# Patient Record
Sex: Female | Born: 1980 | Hispanic: Yes | Smoking: Never smoker
Health system: Southern US, Community
[De-identification: ages and names within clinical notes are randomized; demographics above are authoritative.]

## PROBLEM LIST (undated history)

## (undated) DIAGNOSIS — E079 Disorder of thyroid, unspecified: Secondary | ICD-10-CM

## (undated) HISTORY — PX: OTHER SURGICAL HISTORY: SHX169

## (undated) HISTORY — DX: Disorder of thyroid, unspecified: E07.9

---

## 2005-08-02 DIAGNOSIS — Z227 Latent tuberculosis: Secondary | ICD-10-CM

## 2005-08-02 HISTORY — DX: Latent tuberculosis: Z22.7

## 2015-08-03 DIAGNOSIS — A6000 Herpesviral infection of urogenital system, unspecified: Secondary | ICD-10-CM

## 2015-08-03 HISTORY — DX: Herpesviral infection of urogenital system, unspecified: A60.00

## 2022-01-21 ENCOUNTER — Emergency Department
Admission: EM | Admit: 2022-01-21 | Discharge: 2022-01-22 | Disposition: A | Discharge status: Home / Self Care | Payer: Self-pay | Attending: Emergency Medicine | Admitting: Emergency Medicine

## 2022-01-21 ENCOUNTER — Encounter: Payer: Self-pay | Admitting: Emergency Medicine

## 2022-01-21 ENCOUNTER — Emergency Department: Payer: Self-pay

## 2022-01-21 DIAGNOSIS — R079 Chest pain, unspecified: Secondary | ICD-10-CM | POA: Insufficient documentation

## 2022-01-21 LAB — URINALYSIS, ROUTINE W REFLEX MICROSCOPIC
Bilirubin Urine: NEGATIVE
Glucose, UA: NEGATIVE mg/dL
Ketones, ur: NEGATIVE mg/dL
Leukocytes,Ua: NEGATIVE
Nitrite: NEGATIVE
Protein, ur: NEGATIVE mg/dL
Specific Gravity, Urine: 1.017 (ref 1.005–1.030)
pH: 8 (ref 5.0–8.0)

## 2022-01-21 LAB — BASIC METABOLIC PANEL
Anion gap: 4 — ABNORMAL LOW (ref 5–15)
BUN: 16 mg/dL (ref 6–20)
CO2: 27 mmol/L (ref 22–32)
Calcium: 9.6 mg/dL (ref 8.9–10.3)
Chloride: 107 mmol/L (ref 98–111)
Creatinine, Ser: 0.66 mg/dL (ref 0.44–1.00)
GFR, Estimated: >60 mL/min (ref >60)
Glucose, Bld: 116 mg/dL — ABNORMAL HIGH (ref 70–99)
Potassium: 3.8 mmol/L (ref 3.5–5.1)
Sodium: 138 mmol/L (ref 135–145)

## 2022-01-21 LAB — CBC
HCT: 36.3 % (ref 36.0–46.0)
Hemoglobin: 12.1 g/dL (ref 12.0–15.0)
MCH: 30 pg (ref 26.0–34.0)
MCHC: 33.3 g/dL (ref 30.0–36.0)
MCV: 90.1 fL (ref 80.0–100.0)
Platelets: 396 10*3/uL (ref 150–400)
RBC: 4.03 MIL/uL (ref 3.87–5.11)
RDW: 13.3 % (ref 11.5–15.5)
WBC: 8.1 10*3/uL (ref 4.0–10.5)
nRBC: 0 % (ref 0.0–0.2)

## 2022-01-21 LAB — TROPONIN I (HIGH SENSITIVITY): Troponin I (High Sensitivity): <2 ng/L (ref <18)

## 2022-01-21 LAB — POC URINE PREG, ED: Preg Test, Ur: NEGATIVE

## 2022-01-21 NOTE — ED Triage Notes (Signed)
Pt presents via POV with complaints of left sided CP that has been going on for for the last 4 months but for the last week its has occurred 2-3 times a day. She describes pain as heaviness. Denies fevers, chills, N/V.

## 2022-03-16 ENCOUNTER — Ambulatory Visit: Payer: Self-pay | Admitting: Cardiology

## 2022-03-16 ENCOUNTER — Encounter: Payer: Self-pay | Admitting: Cardiology

## 2022-08-27 ENCOUNTER — Other Ambulatory Visit: Payer: Self-pay

## 2022-08-27 ENCOUNTER — Emergency Department: Payer: Self-pay

## 2022-08-27 ENCOUNTER — Emergency Department
Admission: EM | Admit: 2022-08-27 | Discharge: 2022-08-27 | Disposition: A | Discharge status: Home / Self Care | Payer: Self-pay | Attending: Emergency Medicine | Admitting: Emergency Medicine

## 2022-08-27 DIAGNOSIS — R102 Pelvic and perineal pain unspecified side: Secondary | ICD-10-CM

## 2022-08-27 DIAGNOSIS — N898 Other specified noninflammatory disorders of vagina: Secondary | ICD-10-CM | POA: Insufficient documentation

## 2022-08-27 LAB — COMPREHENSIVE METABOLIC PANEL
ALT: 19 U/L (ref 0–44)
AST: 26 U/L (ref 15–41)
Albumin: 4.3 g/dL (ref 3.5–5.0)
Alkaline Phosphatase: 66 U/L (ref 38–126)
Anion gap: 8 (ref 5–15)
BUN: 14 mg/dL (ref 6–20)
CO2: 23 mmol/L (ref 22–32)
Calcium: 9.3 mg/dL (ref 8.9–10.3)
Chloride: 105 mmol/L (ref 98–111)
Creatinine, Ser: 0.71 mg/dL (ref 0.44–1.00)
GFR, Estimated: >60 mL/min (ref >60)
Glucose, Bld: 110 mg/dL — ABNORMAL HIGH (ref 70–99)
Potassium: 3.6 mmol/L (ref 3.5–5.1)
Sodium: 136 mmol/L (ref 135–145)
Total Bilirubin: 0.6 mg/dL (ref 0.3–1.2)
Total Protein: 8.2 g/dL — ABNORMAL HIGH (ref 6.5–8.1)

## 2022-08-27 LAB — URINALYSIS, ROUTINE W REFLEX MICROSCOPIC
Bacteria, UA: NONE SEEN
Bilirubin Urine: NEGATIVE
Glucose, UA: NEGATIVE mg/dL
Ketones, ur: NEGATIVE mg/dL
Nitrite: NEGATIVE
Protein, ur: NEGATIVE mg/dL
Specific Gravity, Urine: 1.017 (ref 1.005–1.030)
pH: 5 (ref 5.0–8.0)

## 2022-08-27 LAB — CBC
HCT: 36.1 % (ref 36.0–46.0)
Hemoglobin: 11.7 g/dL — ABNORMAL LOW (ref 12.0–15.0)
MCH: 29.9 pg (ref 26.0–34.0)
MCHC: 32.4 g/dL (ref 30.0–36.0)
MCV: 92.3 fL (ref 80.0–100.0)
Platelets: 320 10*3/uL (ref 150–400)
RBC: 3.91 MIL/uL (ref 3.87–5.11)
RDW: 13.5 % (ref 11.5–15.5)
WBC: 7.7 10*3/uL (ref 4.0–10.5)
nRBC: 0 % (ref 0.0–0.2)

## 2022-08-27 LAB — WET PREP, GENITAL
Clue Cells Wet Prep HPF POC: NONE SEEN
Sperm: NONE SEEN
Trich, Wet Prep: NONE SEEN
WBC, Wet Prep HPF POC: <10 — AB (ref <10)
Yeast Wet Prep HPF POC: NONE SEEN

## 2022-08-27 LAB — CHLAMYDIA/NGC RT PCR (ARMC ONLY)
Chlamydia Tr: NOT DETECTED
N gonorrhoeae: NOT DETECTED

## 2022-08-27 LAB — POC URINE PREG, ED: Preg Test, Ur: NEGATIVE

## 2022-08-27 LAB — LIPASE, BLOOD: Lipase: 65 U/L — ABNORMAL HIGH (ref 11–51)

## 2022-08-27 MED ORDER — KETOROLAC TROMETHAMINE 30 MG/ML IJ SOLN
30.0000 mg | Freq: Once | INTRAMUSCULAR | Status: AC
  Administered 2022-08-27: 30 mg via INTRAMUSCULAR
  Filled 2022-08-27: qty 1

## 2022-08-27 NOTE — ED Provider Notes (Signed)
[facility] Provider Note  Patient Contact: 10:14 PM (approximate)   History   Vaginal Pain   HPI  Patty Summers is a 42 y.o. female with a largely unremarkable past medical history, presents to the emergency department with lower pelvic pain, patient localizes her pain to suprapubic region of her abdomen.  She is also had some increased vaginal discharge.  Denies unprotected sex.  No vaginal itching or deep dyspareunia.  No fever or chills.  No similar symptoms in the past.      Physical Exam   Triage Vital Signs: ED Triage Vitals [08/27/22 1938]  Enc Vitals Group     BP (!) 127/91     Pulse Rate 68     Resp 16     Temp 97.9 F (36.6 C)     Temp Source Oral     SpO2 99 %     Weight 170 lb (77.1 kg)     Height 5\' 3"  (1.6 m)     Head Circumference      Peak Flow      Pain Score 8     Pain Loc      Pain Edu?      Excl. in GC?     Most recent vital signs: Vitals:   08/27/22 1938  BP: (!) 127/91  Pulse: 68  Resp: 16  Temp: 97.9 F (36.6 C)  SpO2: 99%     General: Alert and in no acute distress. Eyes:  PERRL. EOMI. Head: No acute traumatic findings ENT:      Nose: No congestion/rhinnorhea.      Mouth/Throat: Mucous membranes are moist. Neck: No stridor. No cervical spine tenderness to palpation. Cardiovascular:  Good peripheral perfusion Respiratory: Normal respiratory effort without tachypnea or retractions. Lungs CTAB. Good air entry to the bases with no decreased or absent breath sounds. Gastrointestinal: Bowel sounds 4 quadrants. Soft and nontender to palpation. No guarding or rigidity. No palpable masses. No distention. No CVA tenderness. Musculoskeletal: Full range of motion to all extremities.  Neurologic:  No gross focal neurologic deficits are appreciated.  Skin:   No rash noted Other:   ED Results / Procedures / Treatments   Labs (all labs ordered are listed, but only abnormal results are displayed) Labs  Reviewed  WET PREP, GENITAL - Abnormal; Notable for the following components:      Result Value   WBC, Wet Prep HPF POC <10 (*)    All other components within normal limits  LIPASE, BLOOD - Abnormal; Notable for the following components:   Lipase 65 (*)    All other components within normal limits  COMPREHENSIVE METABOLIC PANEL - Abnormal; Notable for the following components:   Glucose, Bld 110 (*)    Total Protein 8.2 (*)    All other components within normal limits  CBC - Abnormal; Notable for the following components:   Hemoglobin 11.7 (*)    All other components within normal limits  URINALYSIS, ROUTINE W REFLEX MICROSCOPIC - Abnormal; Notable for the following components:   Color, Urine YELLOW (*)    APPearance CLEAR (*)    Hgb urine dipstick SMALL (*)    Leukocytes,Ua TRACE (*)    All other components within normal limits  CHLAMYDIA/NGC RT PCR ([facility] ONLY)            POC URINE PREG, ED        RADIOLOGY  I personally viewed and evaluated these images as part of my medical  decision making, as well as reviewing the written report by the radiologist.  ED Provider Interpretation: Mixed echogenicity cystic-appearing lesion of the left ovary, 1.2 cm   PROCEDURES:  Critical Care performed: No  Procedures   MEDICATIONS ORDERED IN ED: Medications  ketorolac (TORADOL) 30 MG/ML injection 30 mg (has no administration in time range)     IMPRESSION / MDM / ASSESSMENT AND PLAN / ED COURSE  I reviewed the triage vital signs and the nursing notes.                              Assessment and plan Pelvic pain 42 year old female presents to the emergency department with lower pelvic pain for the past 2 to 3 days.  Vital signs were reassuring at triage.  On exam, patient alert, active and nontoxic-appearing.  CBC and CMP reassuring.  Urine pregnancy test was negative.  Urinalysis shows no signs of UTI.  Pelvic ultrasound shows a 1.2 cm mixed echogenicity cystic-appearing  lesion of the left ovary.  Suspect ovarian cyst.  I recommended repeat ultrasound in the next 2 to 3 months to assess for resolution.  Recommend following up with gynecology.  Patient was given injection of Toradol for pain and Tylenol and ibuprofen alternating were recommended at home.  Return precautions were given to return with new or worsening symptoms.     FINAL CLINICAL IMPRESSION(S) / ED DIAGNOSES   Final diagnoses:  Pelvic pain     Rx / DC Orders   ED Discharge Orders     None        Note:  This document was prepared using Dragon voice recognition software and may include unintentional dictation errors.   Pia Mau [place], Cordelia Poche 08/27/22 2236    Willy Eddy, MD 08/28/22 [phone]

## 2022-08-27 NOTE — Discharge Instructions (Signed)
Repeat pelvic ultrasound in 3 months. It looks like you have a small ovarian cyst on the left. Ovarian cyst typically resolve on their own. You received a Toradol shot in the emergency department. You can take Tylenol and ibuprofen alternating at home for discomfort.

## 2022-08-27 NOTE — ED Triage Notes (Signed)
Pt to ED from home for vaginal pain. Pt had IUD placed in 2020 and has not seen an OBGYN since. Now has painful cramping internally and feels like "its pulling from my belly button". Pt has been having vaginal discharge with foul smell as well. Pt ambulatory in triage.

## 2022-08-27 NOTE — ED Notes (Signed)
Discharge instructions explained to patient and family at this time. Patient and family state they understand and agree.   

## 2022-09-13 ENCOUNTER — Ambulatory Visit (LOCAL_COMMUNITY_HEALTH_CENTER): Payer: Self-pay

## 2022-09-13 ENCOUNTER — Ambulatory Visit: Payer: Self-pay

## 2022-09-13 DIAGNOSIS — Z23 Encounter for immunization: Secondary | ICD-10-CM

## 2022-09-13 DIAGNOSIS — Z719 Counseling, unspecified: Secondary | ICD-10-CM

## 2022-09-13 NOTE — Progress Notes (Signed)
Client seen in nurse clinic for vaccines. Interpretation provided by [facility]  ID [phone].   Client in need of vaccines for immigration.  Client qualified for bridge Covid-19, and met [place]IP eligibility criteria for state MMR, Varicella and Tdap.  VIS provided for all vaccines.  Cost reviewed for vaccines,     COVID-19 Screening questions: Are you feeling sick today? No   Have you ever received a dose of COVID-19 Vaccine? [employer], [place], [place], [place], Other) Yes  If yes, which vaccine and how many doses?    [place] 2   Did you bring the vaccination record card or other documentation?  Yes   Do you have a health condition or are undergoing treatment that makes you moderately or severely immunocompromised? This would include, but not be limited to: cancer, HIV, organ transplant, immunosuppressive therapy/high-dose corticosteroids, or moderate/severe primary immunodeficiency.  No  Have you received COVID-19 vaccine before or during hematopoietic cell transplant (HCT) or CAR-T-cell therapies? No  Have you ever had an allergic reaction to: (This would include a severe allergic reaction or a reaction that caused hives, swelling, or respiratory distress, including wheezing.) A component of a COVID-19 vaccine or a previous dose of COVID-19 vaccine? No   Have you ever had an allergic reaction to another vaccine (other thanCOVID-19 vaccine) or an injectable medication? (This would include a severe allergic reaction or a reaction that caused hives, swelling, or respiratory distress, including wheezing.)   No    Do you have a history of any of the following:  Myocarditis or Pericarditis No  Dermal fillers:  No  Multisystem Inflammatory Syndrome (MIS-C or MIS-A)? No  COVID-19 disease within the past 3 months? No  Vaccinated with monkeypox vaccine in the last 4 weeks? No    After vaccine care reviewed. Copy of NCIR provided.  Fees for next set of vaccines reviewed.  Appointment  schedule for 10/12/2022

## 2022-10-12 ENCOUNTER — Ambulatory Visit: Payer: Self-pay

## 2023-06-09 ENCOUNTER — Ambulatory Visit (LOCAL_COMMUNITY_HEALTH_CENTER): Payer: Self-pay | Admitting: Physician Assistant

## 2023-06-09 VITALS — BP 107/70 | HR 68 | Temp 97.5°F | Ht 63.75 in | Wt 164.4 lb

## 2023-06-09 DIAGNOSIS — Z01419 Encounter for gynecological examination (general) (routine) without abnormal findings: Secondary | ICD-10-CM

## 2023-06-09 DIAGNOSIS — E663 Overweight: Secondary | ICD-10-CM | POA: Insufficient documentation

## 2023-06-09 DIAGNOSIS — Z3009 Encounter for other general counseling and advice on contraception: Secondary | ICD-10-CM

## 2023-06-09 LAB — WET PREP FOR TRICH, YEAST, CLUE
Trichomonas Exam: NEGATIVE
Yeast Exam: NEGATIVE

## 2023-06-09 LAB — HM HEPATITIS C SCREENING LAB: HM Hepatitis Screen: NEGATIVE

## 2023-06-09 LAB — HM HIV SCREENING LAB: HM HIV Screening: NEGATIVE

## 2023-06-09 NOTE — Progress Notes (Signed)
[facility] DEPARTMENT [facility] [address] Main Number: [phone]   Family Planning Visit- Initial Visit  Subjective:  Patty Summers is a 42 y.o.  [phone]   being seen today for an initial annual visit and to discuss reproductive life planning.  The patient is currently using IUD or IUS for pregnancy prevention. Patient reports   does not want a pregnancy in the next year.     report they are looking for a method that provides High efficacy at preventing pregnancy  Patient has the following medical conditions does not have a problem list on file.  Chief Complaint  Patient presents with   Annual Exam    Patient reports she feels well today. She does not have a local primary care provider.  Patient denies h/o abnormal Pap, last Pap about 3 years ago in [place], normal, unsure if HPV testing was done. Is concerned that her husband has discomfort with sex due to her IUD.    Body mass index is 28.44 kg/m. - Patient is eligible for diabetes screening based on BMI> 25 and age >35?  yes HA1C ordered? yes  Patient reports 1  partner/s in last year. Desires STI screening?  Yes  Has patient been screened once for HCV in the past?  No  No results found for: "HCVAB"  Does the patient have current drug use (including MJ), have a partner with drug use, and/or has been incarcerated since last result? No  If yes-- Screen for HCV through [facility]   Does the patient meet criteria for HBV testing? No  Criteria:  -Household, sexual or needle sharing contact with HBV -History of drug use -HIV positive -Those with known Hep C   Health Maintenance Due  Topic Date Due   HIV Screening  Never done   Hepatitis C Screening  Never done   Cervical Cancer Screening (HPV/Pap Cotest)  Never done   INFLUENZA VACCINE  03/03/2023    Review of Systems  Eyes: Negative.   Respiratory:  Positive for shortness of breath.        Had ED eval of SOB and CP sx  last year, no sx in last 2 mo.  Cardiovascular:  Positive for chest pain.       Had ED eval of SOB and CP last year, no sx in last 2 mo. Was told "your heart is fine."  Gastrointestinal: Negative.   Musculoskeletal: Negative.   Neurological:  Positive for headaches.       Frequent headaches, resolve with Tylenol  Endo/Heme/Allergies: Negative.   Psychiatric/Behavioral: Negative.     The following portions of the patient's history were reviewed and updated as appropriate: allergies, current medications, past family history, past medical history, past social history, past surgical history and problem list. Problem list updated.  See flowsheet for other program required questions.  Objective:   Vitals:   06/09/23 1511  BP: 107/70  Pulse: 68  Temp: (!) 97.5 F (36.4 C)  Weight: 164 lb 6.4 oz (74.6 kg)  Height: 5' 3.75" (1.619 m)    Physical Exam Vitals and nursing note reviewed. Exam conducted with a chaperone present Rachel Bo T, interpreter).  Constitutional:      Appearance: Normal appearance. She is obese.  HENT:     Head: Normocephalic and atraumatic.  Cardiovascular:     Rate and Rhythm: Normal rate and regular rhythm.     Pulses: Normal pulses.     Heart sounds: Normal heart sounds.  Pulmonary:     Effort: Pulmonary effort is normal.  Abdominal:     Palpations: Abdomen is soft.  Genitourinary:    Exam position: Lithotomy position.     Pubic Area: No rash or pubic lice.      Tanner stage (genital): 5.     Labia:        Right: No rash, tenderness, lesion or injury.        Left: No rash, tenderness, lesion or injury.      Urethra: No prolapse, urethral pain, urethral swelling or urethral lesion.     Vagina: No vaginal discharge, tenderness, bleeding or prolapsed vaginal walls.     Cervix: No cervical motion tenderness, discharge, friability, lesion, erythema or cervical bleeding.     Uterus: Not tender.      Adnexa:        Right: No tenderness.         Left: No  tenderness.       Rectum: No external hemorrhoid.     Comments: IUD strings seen protruding 2 cm from cervical os. With patient permission, strings are trimmed to 1.5 cm. Musculoskeletal:        General: Normal range of motion.  Lymphadenopathy:     Lower Body: No right inguinal adenopathy. No left inguinal adenopathy.  Skin:    General: Skin is warm and dry.  Neurological:     General: No focal deficit present.     Mental Status: She is alert and oriented to person, place, and time.  Psychiatric:        Mood and Affect: Mood normal.        Behavior: Behavior normal.    Assessment and Plan:  Patty Summers is a 42 y.o. female presenting to the [facility] Department for an initial annual wellness/contraceptive visit  Contraception counseling: Reviewed options based on patient desire and reproductive life plan. Patient is interested in IUD or IUS. This was continued for the patient today. Due to husband's dyspareunia, we discussed the possibility that the IUD strings were causing his discomfort during sex. After shared decision-making I trimmed the IUD strings protruding from the cervical os from 2cm to 1.5cm. She will let us know if this resolves his discomfort during intercourse.  Risks, benefits, and typical effectiveness rates were reviewed.  Questions were answered.  Written information was also given to the patient to review.    Patient is encouraged to establish a local primary care provider, and f/u with them for any recurrence of SOB or CP, or if the headaches worsen or are not resolving with Tylenol at doses listed on the package.  Referred to [facility] for initial bilateral screening mammogram per new USPSTF guidelines.   The patient will follow up in  1 years for surveillance.  The patient was told to call with any further questions, or with any concerns about this method of contraception.  Emphasized use of condoms 100% of the time for STI  prevention.  Educated on ECP and assessed for need of ECP. Patient reported > 120 hours .  Reviewed options and patient desired No method of ECP, declined all    There are no diagnoses linked to this encounter. Due to language barrier, an interpreter Rachel Bo T.) was present during the history-taking and subsequent discussion (and for the physical exam) with this patient.   Return in about 1 year (around 06/08/2024) for Annual well-woman exam.  No future appointments.  Landry Dyke, PA-C

## 2023-06-09 NOTE — Progress Notes (Signed)
Never seen by a provider at [facility] and given returning health history form to complete. Jossie Ng, RN  Wet prep negative and no intervention needed per standing order. Jossie Ng, RN

## 2023-06-10 LAB — HGB A1C W/O EAG: Hgb A1c MFr Bld: 5.8 % — ABNORMAL HIGH (ref 4.8–5.6)

## 2023-06-14 ENCOUNTER — Telehealth: Payer: Self-pay

## 2023-06-14 NOTE — Telephone Encounter (Signed)
Call to client as per result note of E. Sciora CNM and left message her hgb A1c value = 5.8 and provider wants her to call her primary care provider with result. Also left message to call if has questions and number to call provided. Jossie Ng, RN

## 2023-06-15 LAB — IGP, APTIMA HPV
HPV Aptima: NEGATIVE
PAP Smear Comment: 0

## 2023-06-20 ENCOUNTER — Encounter: Payer: Self-pay | Admitting: Family Medicine

## 2023-06-30 IMAGING — CR DG CHEST 2V
1 series · 2 of 2 positions shown · non-contrast
Comparison: None Available.

CLINICAL DATA: Chest pain.

EXAM:
CHEST - 2 VIEW

[Series 1: dg chest 2 view · 0.14mm/px · 2 of 2 slices shown]
[im 1/2]
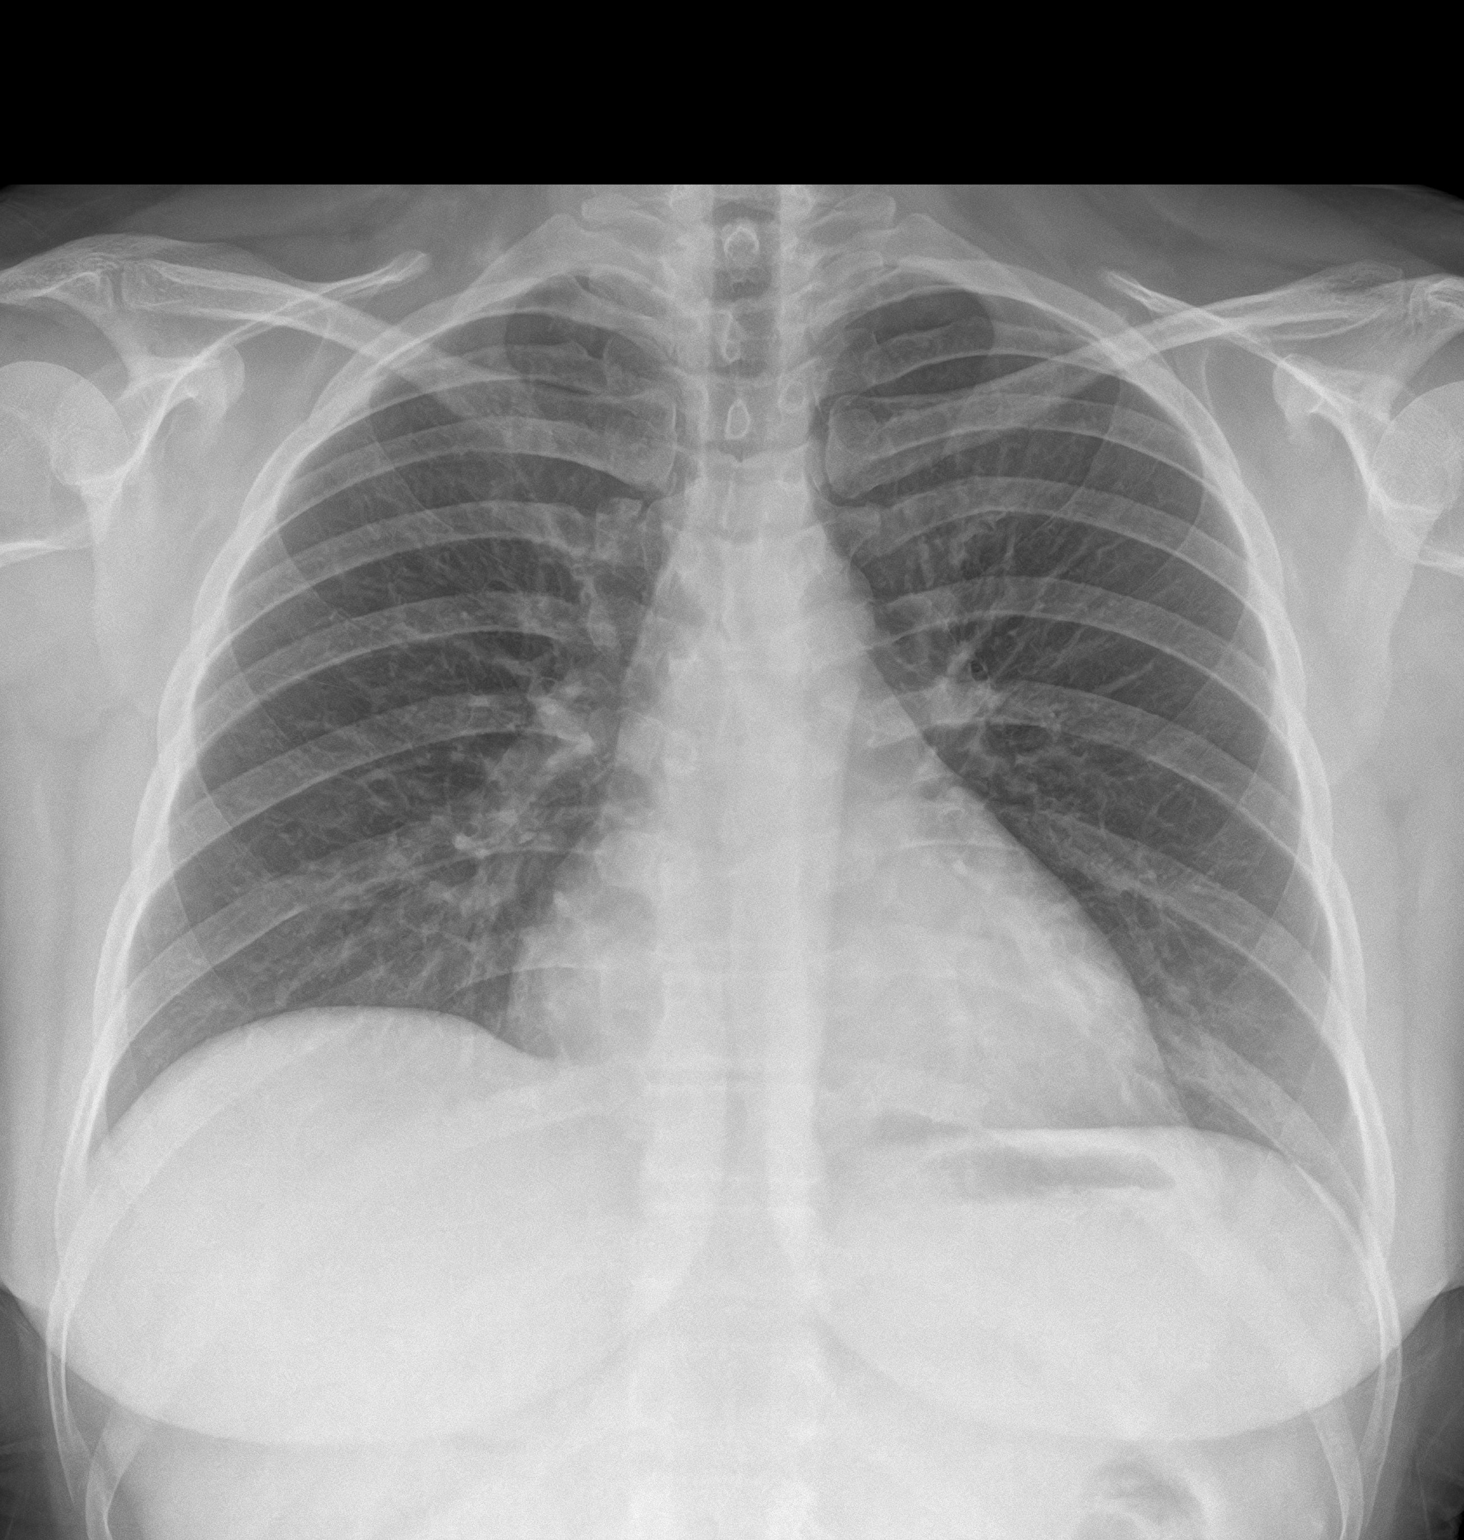
[im 2/2]
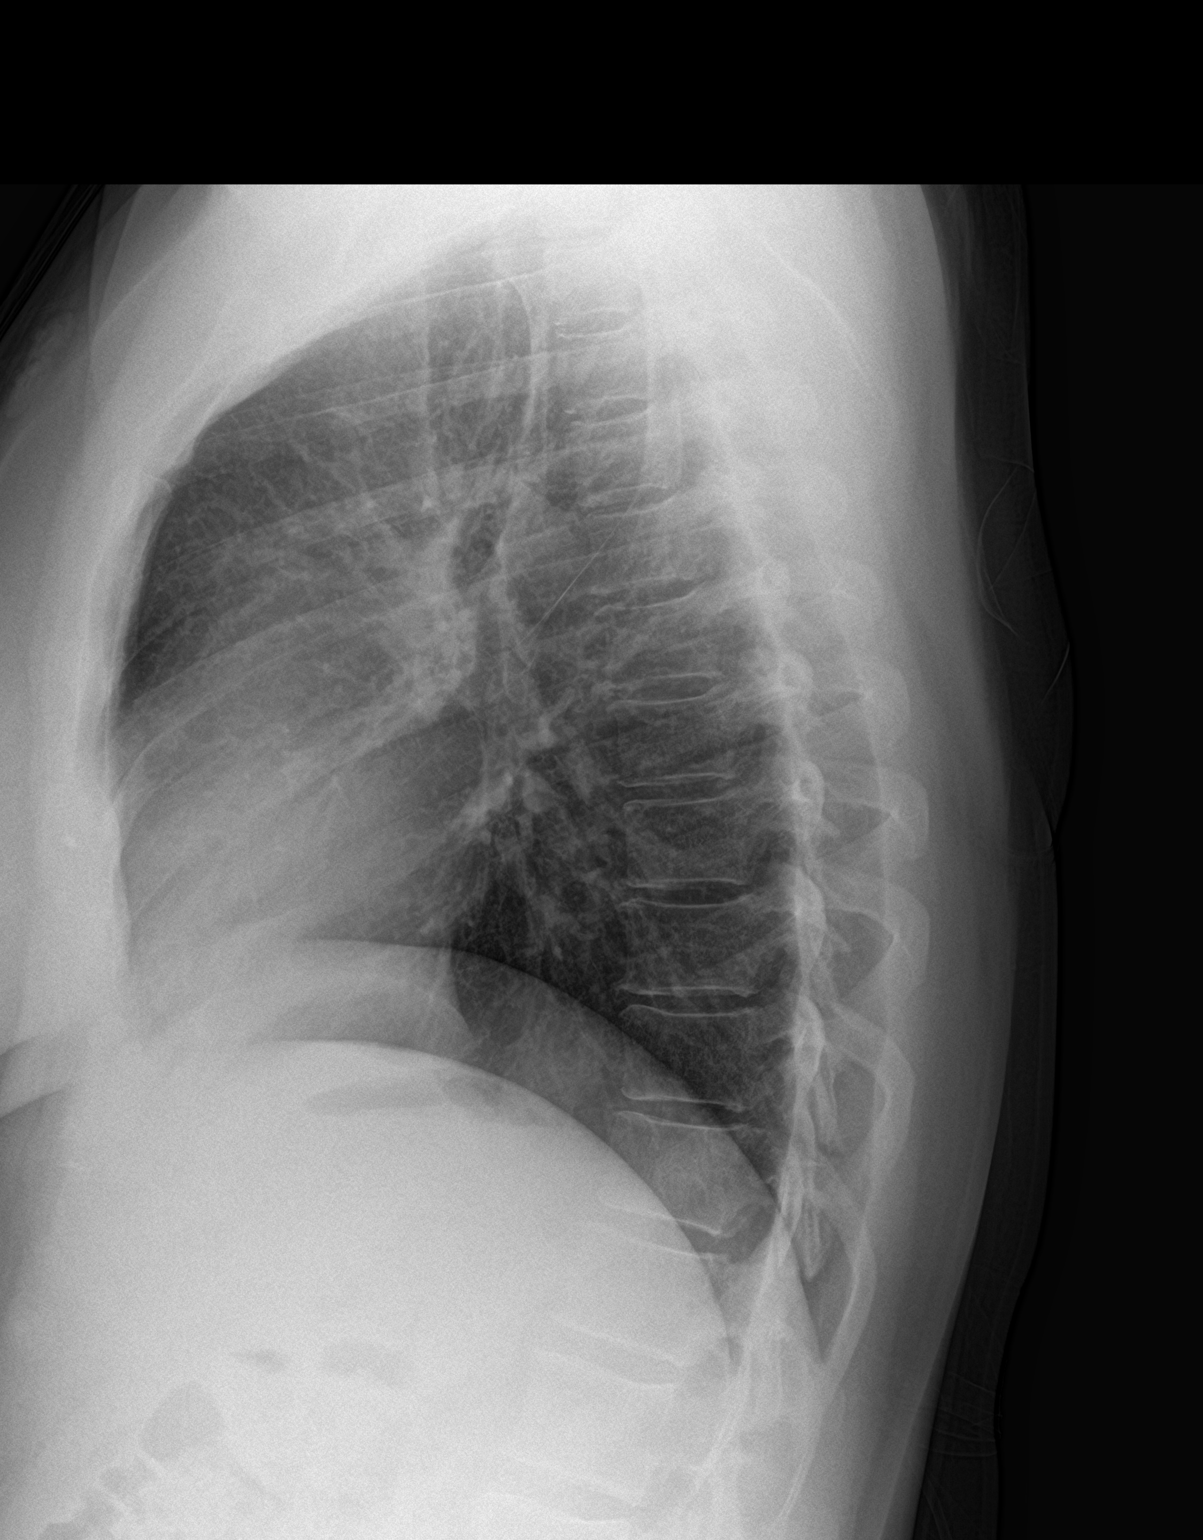

[2 of 2 positions shown; findings below may reference images not displayed]

FINDINGS: The heart size and mediastinal contours are within normal limits.
Both lungs are clear. The visualized skeletal structures are
unremarkable.
IMPRESSION: No active cardiopulmonary disease.
